# Patient Record
Sex: Male | Born: 1980 | Race: Black or African American | Hispanic: No | Marital: Single | Smoking: Current every day smoker
Health system: Southern US, Community
[De-identification: ages and names within clinical notes are randomized; demographics above are authoritative.]

---

## 2006-02-17 ENCOUNTER — Emergency Department: Payer: Self-pay | Admitting: Emergency Medicine

## 2007-01-24 ENCOUNTER — Emergency Department: Payer: Self-pay | Admitting: Emergency Medicine

## 2007-05-20 ENCOUNTER — Emergency Department: Payer: Self-pay | Admitting: Emergency Medicine

## 2007-05-20 ENCOUNTER — Other Ambulatory Visit: Payer: Self-pay

## 2007-05-27 ENCOUNTER — Emergency Department: Payer: Self-pay | Admitting: Emergency Medicine

## 2007-11-01 ENCOUNTER — Emergency Department: Payer: Self-pay | Admitting: Emergency Medicine

## 2007-11-01 ENCOUNTER — Other Ambulatory Visit: Payer: Self-pay

## 2008-02-23 ENCOUNTER — Ambulatory Visit: Payer: Self-pay | Admitting: General Surgery

## 2009-01-25 ENCOUNTER — Emergency Department: Payer: Self-pay | Admitting: Emergency Medicine

## 2010-05-29 ENCOUNTER — Emergency Department: Payer: Self-pay | Admitting: Emergency Medicine

## 2012-04-10 IMAGING — CT CT ABD-PELV W/ CM
1 of 2 series · 15 of 32 positions shown, 19 images · non-contrast
Comparison: none

REASON FOR EXAM: (1) r abd pain; (2) r abd pain
COMMENTS:   May transport without cardiac monitor

PROCEDURE:     CT  - CT ABDOMEN / PELVIS  W  - May 29, 2010  [DATE]
RESULT:     Comparison is made to a prior study dated 05/20/2007.
TECHNIQUE: Helical 3 mm sections were obtained from the lung bases through
the pubic symphysis status post intravenous administration of 100 ml of
[id number] and oral contrast.

[Series 2: 3mm soft tissue · axial · 0.66mm/px · z∈[-608,-221]mm · 15 of 141 slices shown, 19 images]
[im 6/141  soft-tissue]
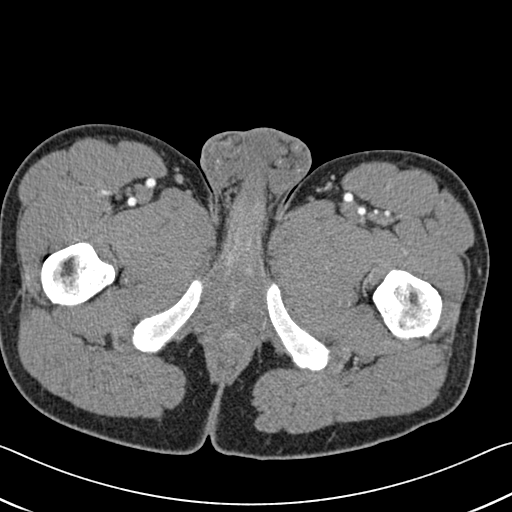
[im 6/141  bone]
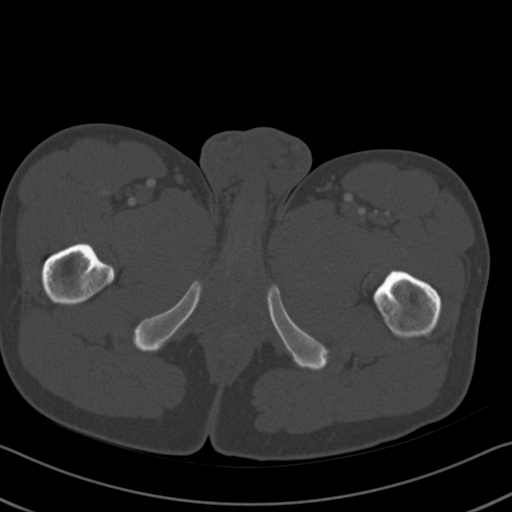
[im 17/141  soft-tissue]
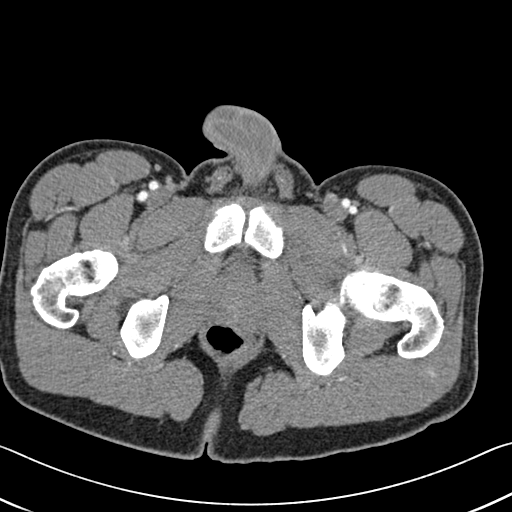
[im 29/141  soft-tissue]
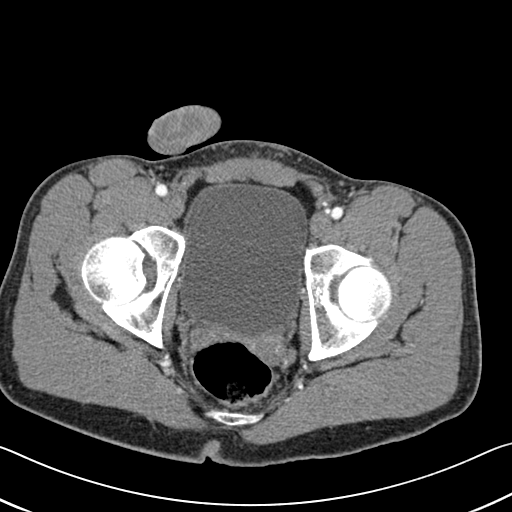
[im 40/141  soft-tissue]
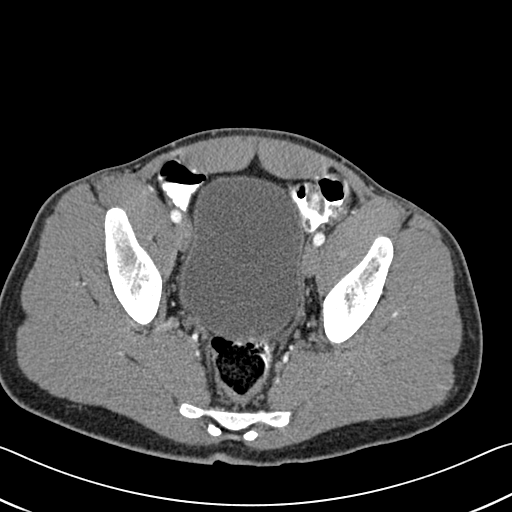
[im 51/141  soft-tissue]
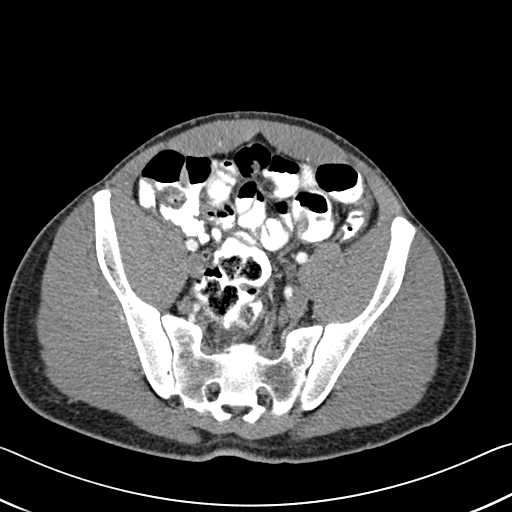
[im 62/141  soft-tissue]
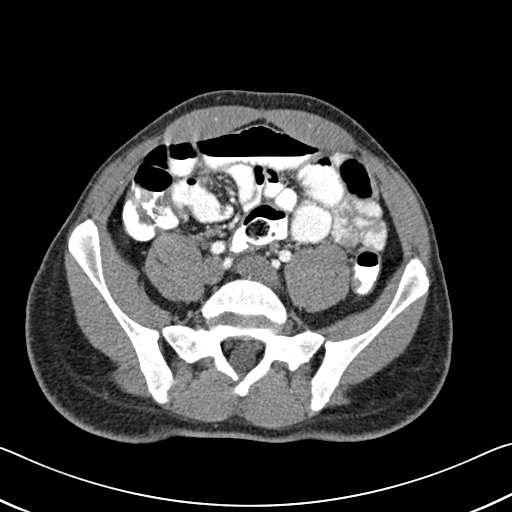
[im 73/141  soft-tissue]
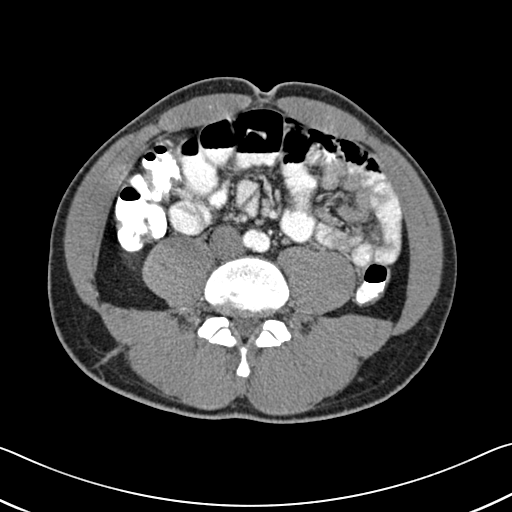
[im 79/141  soft-tissue]
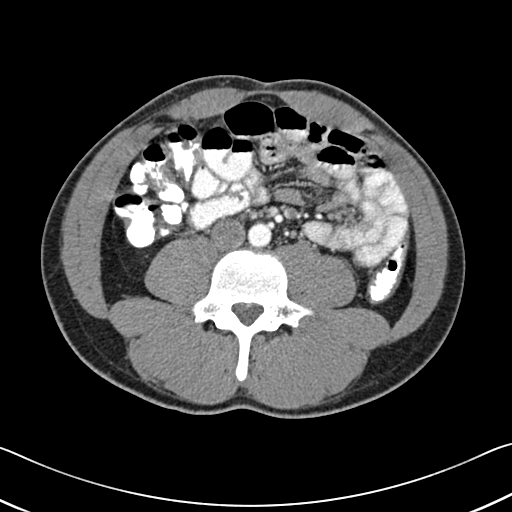
[im 90/141  soft-tissue]
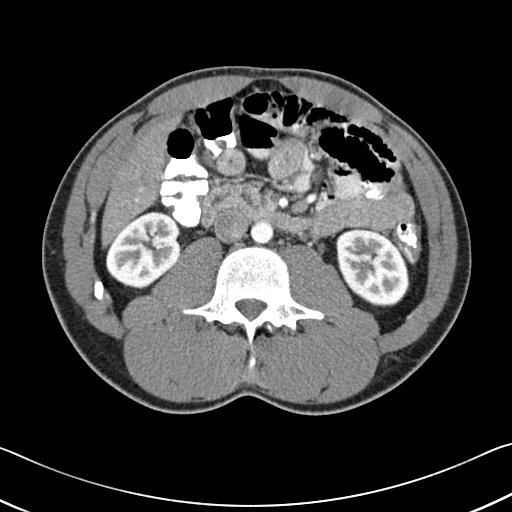
[im 90/141  bone]
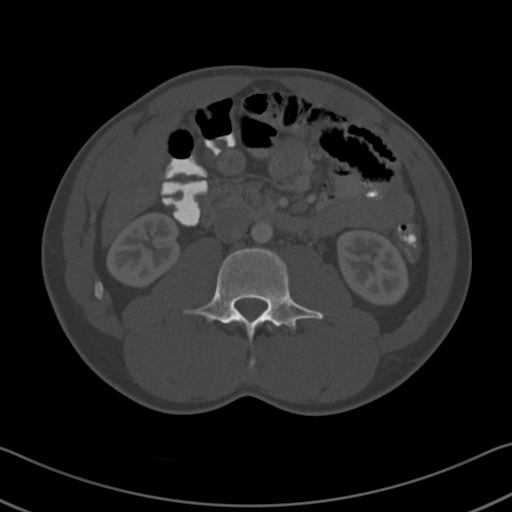
[im 101/141  soft-tissue]
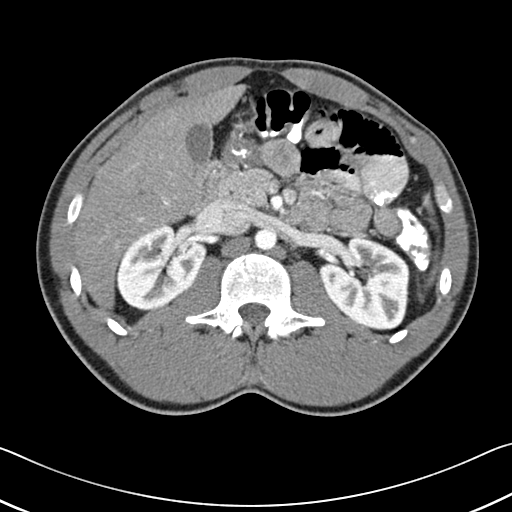
[im 113/141  soft-tissue]
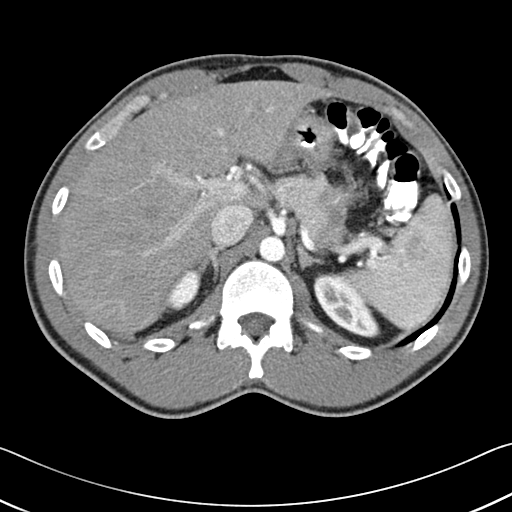
[im 118/141  lung]
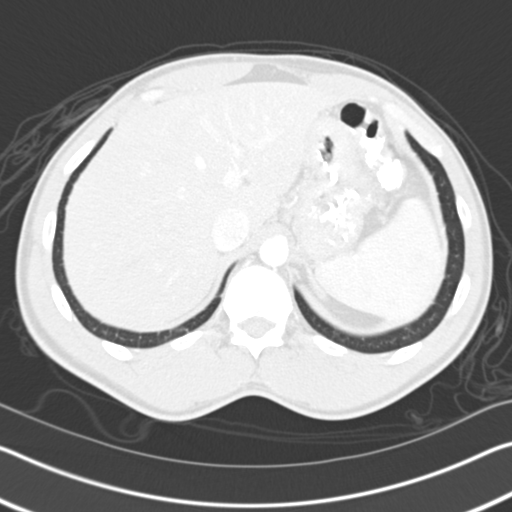
[im 124/141  soft-tissue]
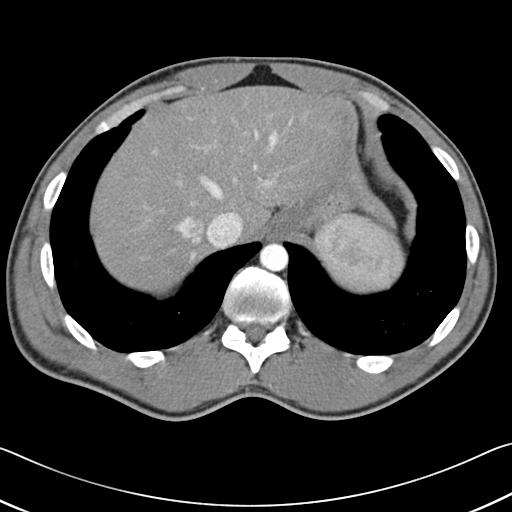
[im 124/141  lung]
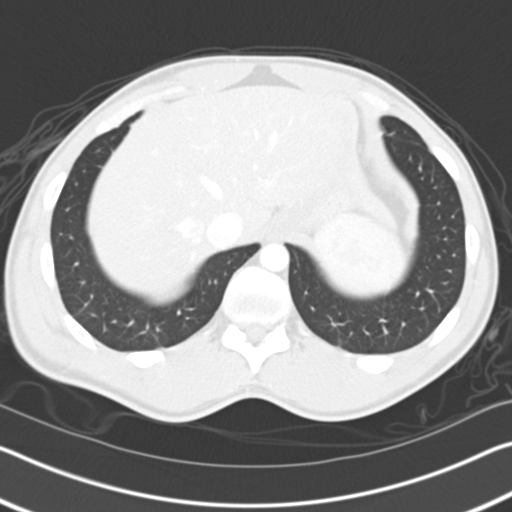
[im 129/141  lung]
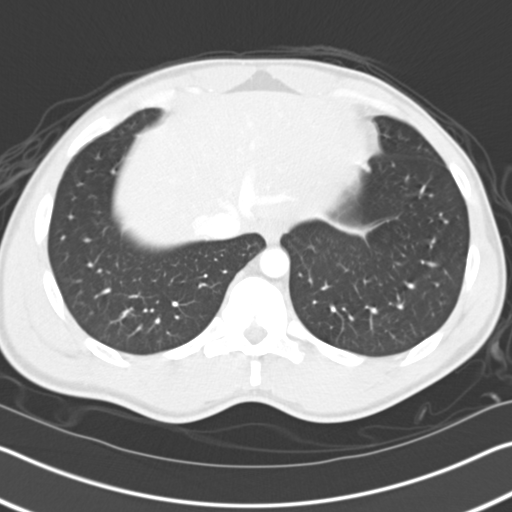
[im 135/141  soft-tissue]
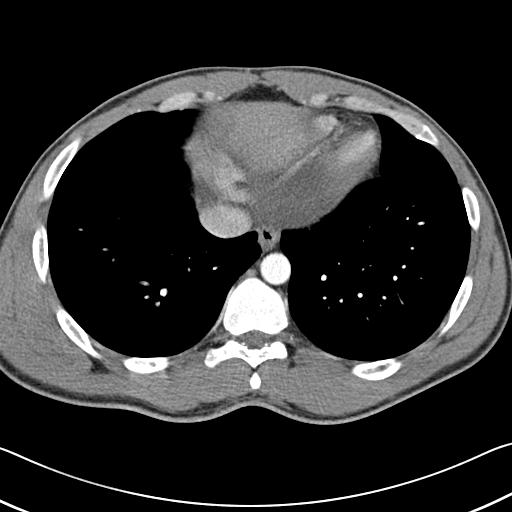
[im 135/141  lung]
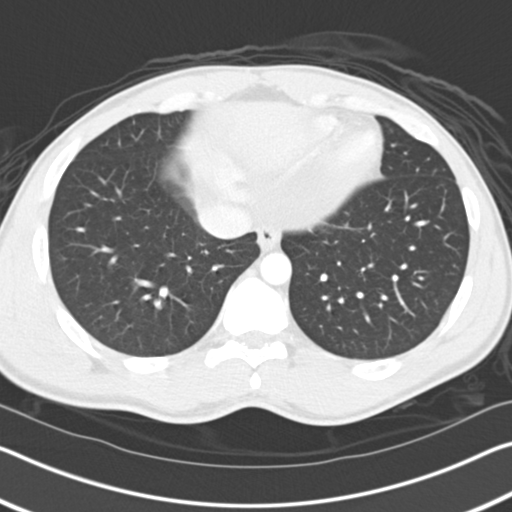

[15 of 32 positions shown; findings below may reference images not displayed]

FINDINGS: The lung bases are unremarkable.

The liver, spleen, adrenals, pancreas and kidneys are unremarkable. There is
no CT evidence of bowel obstruction or secondary signs reflecting enteritis,
colitis, diverticulitis or appendicitis. The appendix is identified and is
unremarkable. There is no evidence of abdominal or pelvic free fluid,
loculated fluid collections, masses or adenopathy. The urinary bladder is
distended. There is no evidence of an abdominal aortic aneurysm or
dissection. The celiac, SMA, IMA and portal vein are opacified.
IMPRESSION: 1.  No CT evidence of obstructive or inflammatory abnormalities.
2.  Distended urinary bladder likely representing the patient's need to void.
3.  Dr. St Jean of the Emergency Department was informed of these findings via
a preliminary faxed report.

## 2012-04-10 IMAGING — US ABDOMEN ULTRASOUND
1 series · 17 of 25 positions shown · non-contrast
Comparison: none

REASON FOR EXAM: r abd pain
COMMENTS:   May transport without cardiac monitor

PROCEDURE:     US  - US ABDOMEN GENERAL SURVEY  - May 29, 2010  [DATE]
RESULT:

[Series 1: abdomen ultrasound · 17 of 52 slices shown]
[im 1/52]
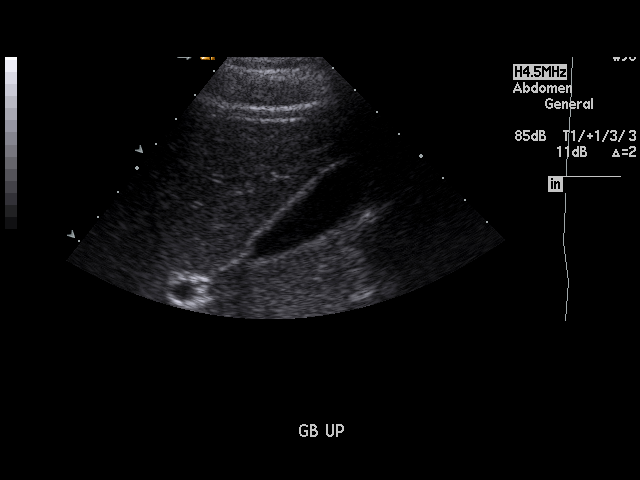
[im 5/52]
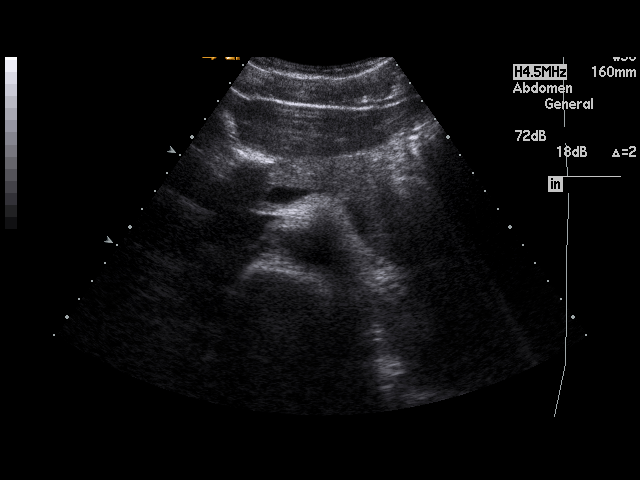
[im 7/52]
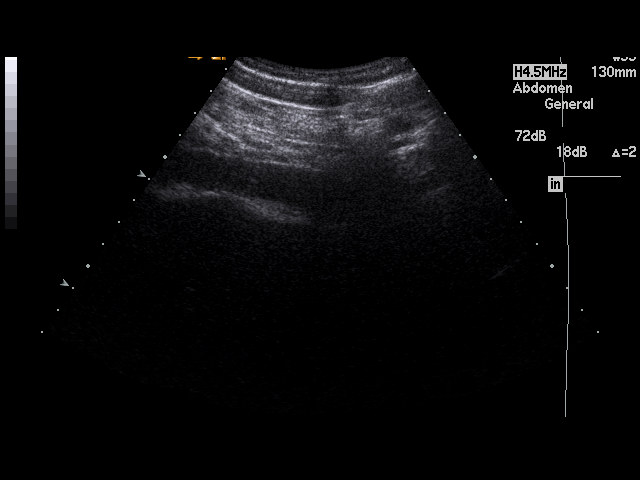
[im 11/52]
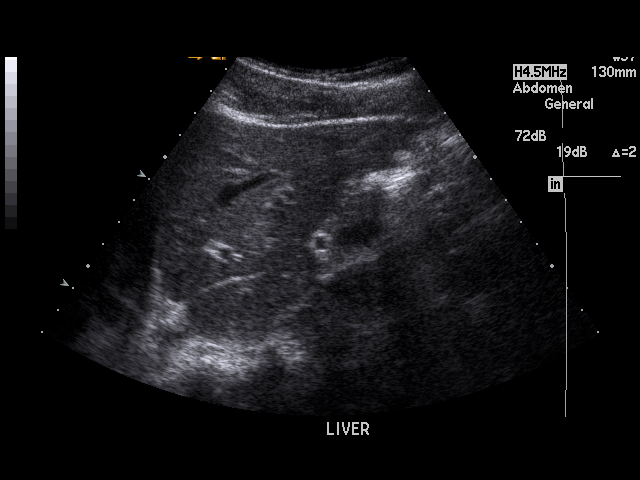
[im 13/52]
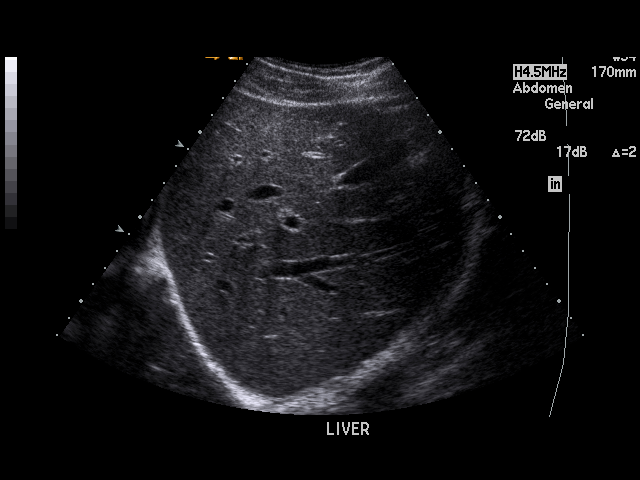
[im 18/52]
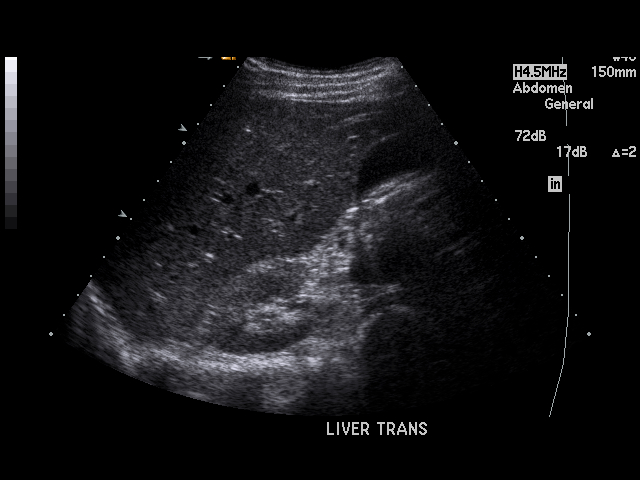
[im 20/52]
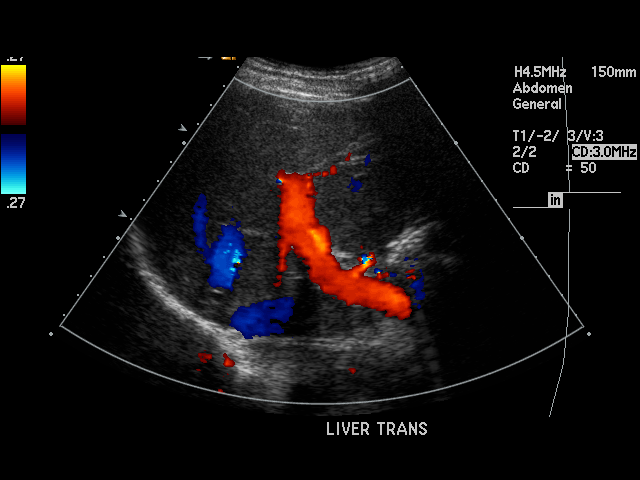
[im 24/52]
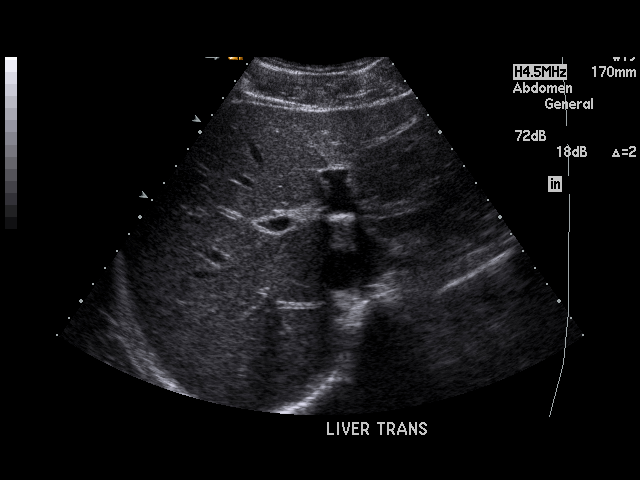
[im 26/52]
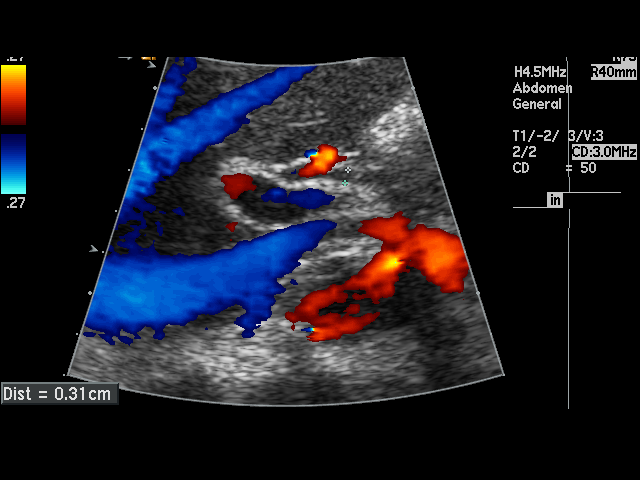
[im 28/52]
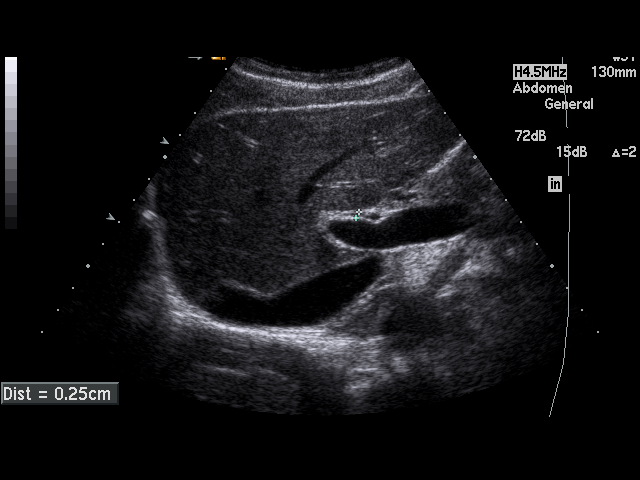
[im 32/52]
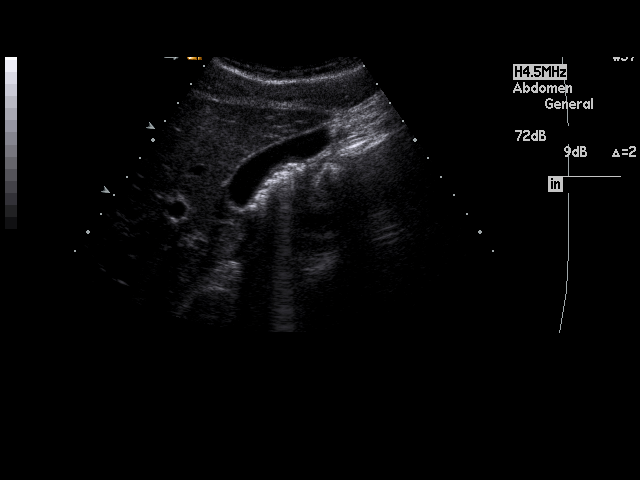
[im 35/52]
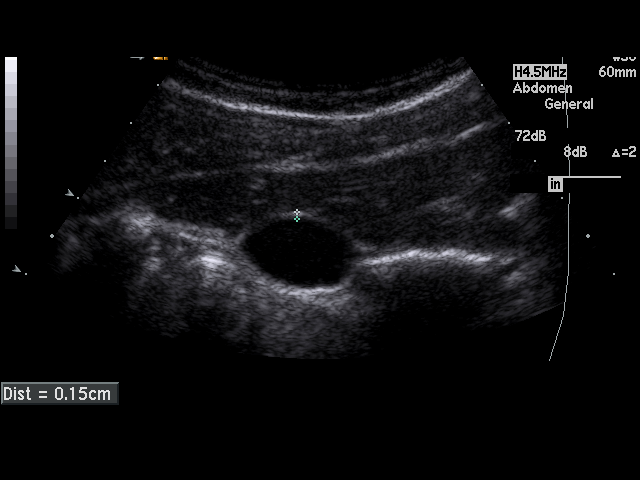
[im 39/52]
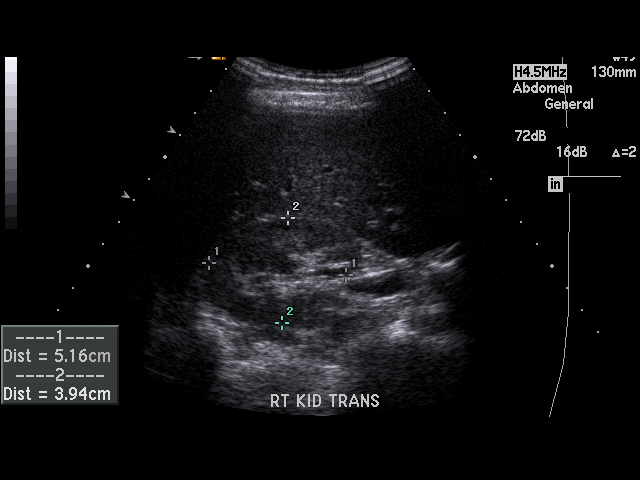
[im 41/52]
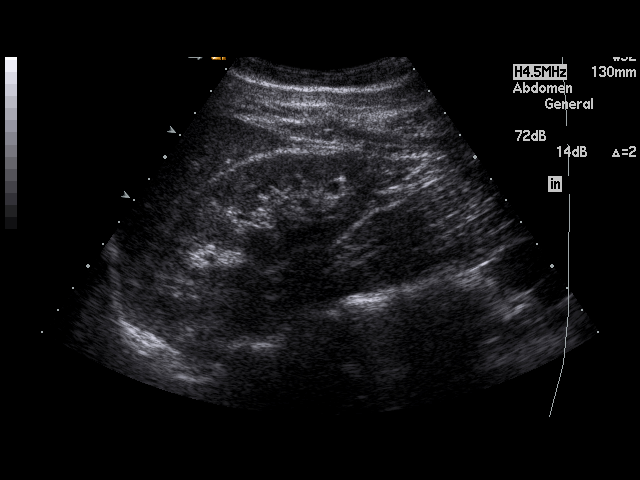
[im 45/52]
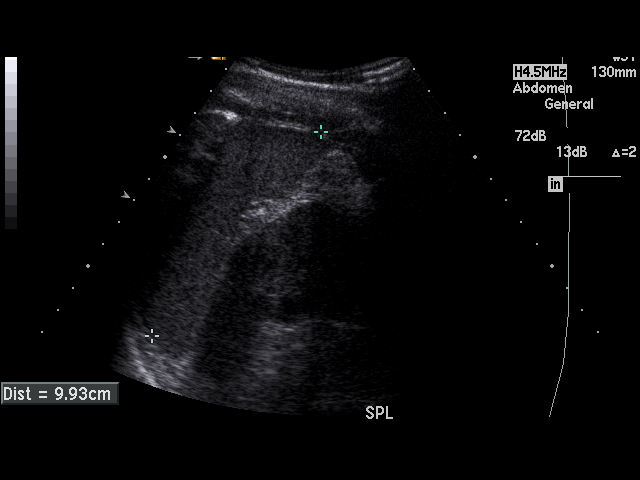
[im 47/52]
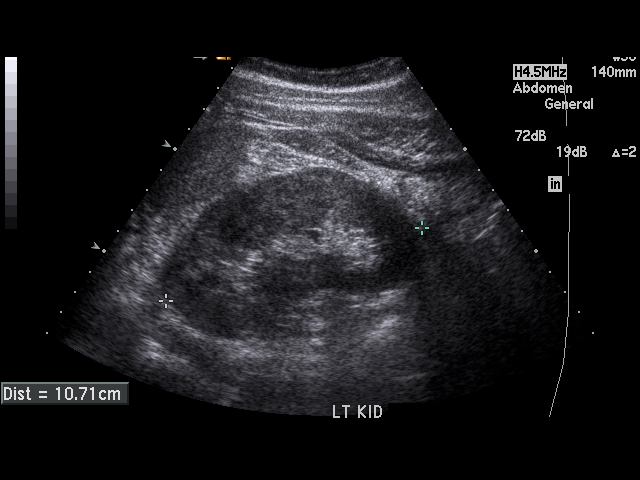
[im 52/52]
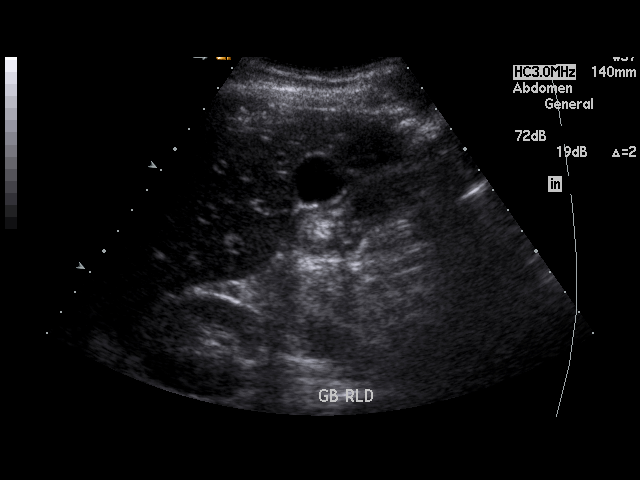

[17 of 25 positions shown; findings below may reference images not displayed]

FINDINGS: The liver demonstrates a homogeneous echotexture. Hepatopetal flow
is demonstrated within the portal vein. The aorta and IVC are unremarkable.
The pancreas is partially visualized and unremarkable. Evaluation of the
gallbladder fossa demonstrates no evidence of pericholecystic fluid,
gallstones or sludging. There is no evidence of a sonographic Murphy's sign.
Gallbladder wall thickness is 1.5 mm. The common bile duct measures 3.7 mm
in diameter. The kidneys demonstrate no evidence of hydronephrosis, masses
or calculi. The right kidney measures 10.8 x 5.1 x 3.9 cm and the left
x 5.0 x 4.7 cm.
IMPRESSION: 1.     Unremarkable Abdominal Ultrasound as described above.
2.     Dr. Shinoda of the Emergency Department was informed of these findings
via a preliminary faxed report.

## 2017-02-12 ENCOUNTER — Ambulatory Visit (INDEPENDENT_AMBULATORY_CARE_PROVIDER_SITE_OTHER): Payer: Commercial Managed Care - PPO

## 2017-02-12 ENCOUNTER — Ambulatory Visit (INDEPENDENT_AMBULATORY_CARE_PROVIDER_SITE_OTHER): Payer: Commercial Managed Care - PPO | Admitting: Podiatry

## 2017-02-12 ENCOUNTER — Encounter: Payer: Self-pay | Admitting: Podiatry

## 2017-02-12 VITALS — BP 115/61 | HR 83 | Resp 16

## 2017-02-12 DIAGNOSIS — S92404A Nondisplaced unspecified fracture of right great toe, initial encounter for closed fracture: Secondary | ICD-10-CM

## 2017-02-12 NOTE — Progress Notes (Signed)
   Subjective:  36 year old healthy male presents to the office today for evaluation of trauma to the right great toe. He states that on 02/09/2017 he was exercising and drop to 25 pound weight plate on his right great toe. Patient states that it's been painful walking there's been significant swelling. Patient is currently taking ibuprofen with relief.    Objective/Physical Exam General: The patient is alert and oriented x3 in no acute distress.  Dermatology: Skin is warm, dry and supple bilateral lower extremities. Negative for open lesions or macerations.  Vascular: Palpable pedal pulses bilaterally. No edema or erythema noted. Capillary refill within normal limits.  Neurological: Epicritic and protective threshold grossly intact bilaterally.   Musculoskeletal Exam: Localized edema with ecchymosis noted to the right great toe  Radiographic Exam:  Closed, nondisplaced fracture noted to the distal phalanx right great toe   Assessment: #1 fracture distal phalanx right great toe #2 pain with ecchymosis and edema right great toe secondary to trauma   Plan of Care:  #1 Patient was evaluated. #2 1 inch Coban wrap was applied to the right great toe. Extra Coban wrap was provided for the patient #3 continue ibuprofen 3 times daily #4 postoperative shoe was dispensed #5 return to clinic in 6 weeks   Felecia Shelling, DPM [facility]  Dr. Felecia Shelling, DPM    [address]                                        [place], [place] [address]                Office [phone]  Fax [phone]

## 2017-03-26 ENCOUNTER — Ambulatory Visit (INDEPENDENT_AMBULATORY_CARE_PROVIDER_SITE_OTHER): Payer: Commercial Managed Care - PPO | Admitting: Podiatry

## 2017-03-26 ENCOUNTER — Ambulatory Visit (INDEPENDENT_AMBULATORY_CARE_PROVIDER_SITE_OTHER): Payer: Commercial Managed Care - PPO

## 2017-03-26 ENCOUNTER — Encounter: Payer: Self-pay | Admitting: Podiatry

## 2017-03-26 DIAGNOSIS — S92404D Nondisplaced unspecified fracture of right great toe, subsequent encounter for fracture with routine healing: Secondary | ICD-10-CM | POA: Diagnosis not present

## 2017-03-26 DIAGNOSIS — S92404A Nondisplaced unspecified fracture of right great toe, initial encounter for closed fracture: Secondary | ICD-10-CM

## 2017-03-28 NOTE — Progress Notes (Signed)
   Subjective:  36 year old healthy male presents to the office today for follow up evaluation of a closed, nondisplaced fracture of the distal phalanx of the right great toe that occurred on 02/09/17 secondary to dropping a 25 pound weight on it. He states the toe nail feels numb and is still attached. He has been wearing the post op shoe provided with some relief of the symptoms.    Objective/Physical Exam General: The patient is alert and oriented x3 in no acute distress.  Dermatology: Skin is warm, dry and supple bilateral lower extremities. Negative for open lesions or macerations.  Vascular: Palpable pedal pulses bilaterally. No edema or erythema noted. Capillary refill within normal limits.  Neurological: Epicritic and protective threshold grossly intact bilaterally.   Musculoskeletal Exam: Localized edema with ecchymosis noted to the right great toe  Radiographic Exam:  Closed, nondisplaced fracture noted to the distal phalanx right great toe   Assessment: #1 fracture distal phalanx right great toe with routine healing #2 pain with ecchymosis and edema right great toe secondary to trauma   Plan of Care:  #1 Patient was evaluated. #2 Transition out of postop shoe into good sneakers #3 return to clinic as needed   Felecia ShellingBrent M. Girolamo Lortie, DPM [facility]  Dr. Felecia ShellingBrent M. Ariela Mochizuki, DPM    [address]                                        [place], [place] [address]                Office [phone]  Fax [phone]

## 2019-05-13 ENCOUNTER — Other Ambulatory Visit: Payer: Self-pay | Admitting: Family Medicine

## 2019-05-13 DIAGNOSIS — Z20822 Contact with and (suspected) exposure to covid-19: Secondary | ICD-10-CM

## 2019-05-19 LAB — NOVEL CORONAVIRUS, NAA: SARS-CoV-2, NAA: NOT DETECTED

## 2019-05-20 ENCOUNTER — Encounter: Payer: Self-pay | Admitting: Obstetrics and Gynecology

## 2019-05-20 ENCOUNTER — Telehealth: Payer: Self-pay

## 2019-05-20 NOTE — Telephone Encounter (Signed)
Pt called for results. Pt informed and discusses common signs and symptoms to watch for and when to call emergency services. Pt verbalized understanding. This encounter was created in error - please disregard.

## 2019-05-20 NOTE — Telephone Encounter (Signed)
Pt called for results. Pt informed and discusses common signs and symptoms to watch for and when to call emergency services. Pt verbalized understanding.

## 2020-08-16 DIAGNOSIS — F411 Generalized anxiety disorder: Secondary | ICD-10-CM | POA: Diagnosis not present

## 2020-08-23 DIAGNOSIS — F411 Generalized anxiety disorder: Secondary | ICD-10-CM | POA: Diagnosis not present

## 2020-08-30 DIAGNOSIS — F411 Generalized anxiety disorder: Secondary | ICD-10-CM | POA: Diagnosis not present

## 2020-09-06 DIAGNOSIS — F411 Generalized anxiety disorder: Secondary | ICD-10-CM | POA: Diagnosis not present

## 2020-09-13 DIAGNOSIS — F411 Generalized anxiety disorder: Secondary | ICD-10-CM | POA: Diagnosis not present

## 2020-09-20 DIAGNOSIS — F411 Generalized anxiety disorder: Secondary | ICD-10-CM | POA: Diagnosis not present

## 2020-09-27 DIAGNOSIS — F411 Generalized anxiety disorder: Secondary | ICD-10-CM | POA: Diagnosis not present

## 2021-07-29 DIAGNOSIS — Z0000 Encounter for general adult medical examination without abnormal findings: Secondary | ICD-10-CM | POA: Diagnosis not present

## 2022-08-18 ENCOUNTER — Other Ambulatory Visit: Payer: Self-pay

## 2022-08-18 MED ORDER — HALOBETASOL PROPIONATE 0.05 % EX CREA
TOPICAL_CREAM | CUTANEOUS | 2 refills | Status: DC
  Filled 2022-08-25: qty 50, 30d supply, fill #0

## 2022-08-25 ENCOUNTER — Other Ambulatory Visit: Payer: Self-pay

## 2022-08-26 ENCOUNTER — Other Ambulatory Visit: Payer: Self-pay

## 2023-08-05 ENCOUNTER — Other Ambulatory Visit: Payer: Self-pay

## 2023-08-05 DIAGNOSIS — J019 Acute sinusitis, unspecified: Secondary | ICD-10-CM | POA: Diagnosis not present

## 2023-08-05 DIAGNOSIS — B9689 Other specified bacterial agents as the cause of diseases classified elsewhere: Secondary | ICD-10-CM | POA: Diagnosis not present

## 2023-08-05 DIAGNOSIS — J309 Allergic rhinitis, unspecified: Secondary | ICD-10-CM | POA: Diagnosis not present

## 2023-08-05 MED ORDER — CEFDINIR 300 MG PO CAPS
300.0000 mg | ORAL_CAPSULE | Freq: Two times a day (BID) | ORAL | 0 refills | Status: AC
  Filled 2023-08-05: qty 20, 10d supply, fill #0

## 2023-08-06 ENCOUNTER — Other Ambulatory Visit: Payer: Self-pay

## 2023-08-06 MED ORDER — HALOBETASOL PROPIONATE 0.05 % EX CREA
1.0000 | TOPICAL_CREAM | Freq: Two times a day (BID) | CUTANEOUS | 2 refills | Status: AC
  Filled 2023-08-06: qty 50, 25d supply, fill #0
  Filled 2023-08-26 – 2023-09-29 (×2): qty 50, 30d supply, fill #0
  Filled 2024-07-19: qty 15, 30d supply, fill #1

## 2023-08-20 ENCOUNTER — Other Ambulatory Visit: Payer: Self-pay

## 2023-08-26 ENCOUNTER — Other Ambulatory Visit: Payer: Self-pay

## 2023-09-06 ENCOUNTER — Other Ambulatory Visit: Payer: Self-pay

## 2023-09-29 ENCOUNTER — Other Ambulatory Visit: Payer: Self-pay

## 2023-10-06 ENCOUNTER — Other Ambulatory Visit: Payer: Self-pay

## 2023-10-06 MED ORDER — CYCLOBENZAPRINE HCL 10 MG PO TABS
10.0000 mg | ORAL_TABLET | Freq: Every evening | ORAL | 0 refills | Status: AC | PRN
  Filled 2023-10-06: qty 10, 10d supply, fill #0

## 2024-06-12 ENCOUNTER — Other Ambulatory Visit: Payer: Self-pay

## 2024-06-12 DIAGNOSIS — H10211 Acute toxic conjunctivitis, right eye: Secondary | ICD-10-CM | POA: Diagnosis not present

## 2024-06-12 DIAGNOSIS — W228XXA Striking against or struck by other objects, initial encounter: Secondary | ICD-10-CM | POA: Diagnosis not present

## 2024-06-12 MED ORDER — CYCLOPENTOLATE HCL 1 % OP SOLN
1.0000 [drp] | Freq: Three times a day (TID) | OPHTHALMIC | 1 refills | Status: AC
  Filled 2024-06-12: qty 2, 14d supply, fill #0

## 2024-06-12 MED ORDER — NEOMYCIN-POLYMYXIN-DEXAMETH 3.5-10000-0.1 OP SUSP
1.0000 [drp] | Freq: Four times a day (QID) | OPHTHALMIC | 0 refills | Status: AC
  Filled 2024-06-12: qty 5, 25d supply, fill #0

## 2024-06-13 DIAGNOSIS — W228XXA Striking against or struck by other objects, initial encounter: Secondary | ICD-10-CM | POA: Diagnosis not present

## 2024-06-13 DIAGNOSIS — H10211 Acute toxic conjunctivitis, right eye: Secondary | ICD-10-CM | POA: Diagnosis not present

## 2024-07-19 ENCOUNTER — Other Ambulatory Visit (HOSPITAL_COMMUNITY): Payer: Self-pay

## 2024-07-19 ENCOUNTER — Other Ambulatory Visit: Payer: Self-pay

## 2024-07-20 ENCOUNTER — Other Ambulatory Visit: Payer: Self-pay

## 2024-07-20 MED ORDER — HALOBETASOL PROPIONATE 0.05 % EX CREA
TOPICAL_CREAM | CUTANEOUS | 2 refills | Status: AC
  Filled 2024-07-20: qty 50, 30d supply, fill #0

## 2024-07-21 ENCOUNTER — Other Ambulatory Visit: Payer: Self-pay

## 2024-07-31 ENCOUNTER — Other Ambulatory Visit: Payer: Self-pay

## 2024-08-24 DIAGNOSIS — I781 Nevus, non-neoplastic: Secondary | ICD-10-CM | POA: Diagnosis not present

## 2024-08-24 DIAGNOSIS — K5909 Other constipation: Secondary | ICD-10-CM | POA: Diagnosis not present

## 2024-08-24 DIAGNOSIS — E782 Mixed hyperlipidemia: Secondary | ICD-10-CM | POA: Diagnosis not present

## 2024-08-24 DIAGNOSIS — Z0000 Encounter for general adult medical examination without abnormal findings: Secondary | ICD-10-CM | POA: Diagnosis not present
